# Patient Record
Sex: Male | Born: 1985 | Race: Asian | Hispanic: No | Marital: Single | Smoking: Current some day smoker
Health system: Southern US, Community
[De-identification: ages and names within clinical notes are randomized; demographics above are authoritative.]

## PROBLEM LIST (undated history)

## (undated) DIAGNOSIS — K56609 Unspecified intestinal obstruction, unspecified as to partial versus complete obstruction: Secondary | ICD-10-CM

## (undated) DIAGNOSIS — J45909 Unspecified asthma, uncomplicated: Secondary | ICD-10-CM

## (undated) DIAGNOSIS — F419 Anxiety disorder, unspecified: Secondary | ICD-10-CM

## (undated) DIAGNOSIS — T7840XA Allergy, unspecified, initial encounter: Secondary | ICD-10-CM

## (undated) HISTORY — DX: Anxiety disorder, unspecified: F41.9

## (undated) HISTORY — DX: Allergy, unspecified, initial encounter: T78.40XA

---
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
## 2007-02-05 ENCOUNTER — Emergency Department (HOSPITAL_COMMUNITY): Admission: EM | Admit: 2007-02-05 | Discharge: 2007-02-05 | Payer: Self-pay | Admitting: Emergency Medicine

## 2008-08-03 ENCOUNTER — Emergency Department (HOSPITAL_COMMUNITY): Admission: EM | Admit: 2008-08-03 | Discharge: 2008-08-03 | Payer: Self-pay | Admitting: Gastroenterology
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
## 2013-08-06 ENCOUNTER — Emergency Department (HOSPITAL_COMMUNITY)
Admission: EM | Admit: 2013-08-06 | Discharge: 2013-08-06 | Disposition: A | Discharge status: Home / Self Care | Payer: No Typology Code available for payment source | Attending: Emergency Medicine | Admitting: Emergency Medicine

## 2013-08-06 ENCOUNTER — Encounter (HOSPITAL_COMMUNITY): Payer: Self-pay | Admitting: Emergency Medicine

## 2013-08-06 DIAGNOSIS — Z043 Encounter for examination and observation following other accident: Secondary | ICD-10-CM | POA: Insufficient documentation

## 2013-08-06 DIAGNOSIS — F17200 Nicotine dependence, unspecified, uncomplicated: Secondary | ICD-10-CM | POA: Insufficient documentation

## 2013-08-06 DIAGNOSIS — Y92410 Unspecified street and highway as the place of occurrence of the external cause: Secondary | ICD-10-CM | POA: Insufficient documentation

## 2013-08-06 DIAGNOSIS — Y9389 Activity, other specified: Secondary | ICD-10-CM | POA: Insufficient documentation

## 2013-08-06 DIAGNOSIS — J45909 Unspecified asthma, uncomplicated: Secondary | ICD-10-CM | POA: Insufficient documentation

## 2013-08-06 HISTORY — DX: Unspecified asthma, uncomplicated: J45.909
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
## 2013-08-10 NOTE — ED Provider Notes (Signed)
Medical screening examination/treatment/procedure(s) were performed by non-physician practitioner and as supervising physician I was immediately available for consultation/collaboration.  EKG Interpretation   None         Darnesha Diloreto M Wilhelmena Zea, MD 08/10/13 1604 

## 2014-04-30 ENCOUNTER — Ambulatory Visit (INDEPENDENT_AMBULATORY_CARE_PROVIDER_SITE_OTHER): Payer: 59 | Admitting: Family Medicine

## 2014-04-30 VITALS — BP 122/82 | HR 92 | Temp 98.2°F | Resp 16 | Ht 67.0 in | Wt 146.2 lb

## 2014-04-30 DIAGNOSIS — L239 Allergic contact dermatitis, unspecified cause: Secondary | ICD-10-CM

## 2014-04-30 DIAGNOSIS — L259 Unspecified contact dermatitis, unspecified cause: Secondary | ICD-10-CM

## 2014-04-30 DIAGNOSIS — L299 Pruritus, unspecified: Secondary | ICD-10-CM

## 2014-04-30 MED ORDER — PREDNISONE 20 MG PO TABS
ORAL_TABLET | ORAL | Status: AC
Start: 2014-04-30 — End: ?

## 2014-04-30 MED ORDER — EPINEPHRINE 0.3 MG/0.3ML IJ SOAJ
0.3000 mg | Freq: Once | INTRAMUSCULAR | Status: AC
Start: 2014-04-30 — End: ?

## 2014-04-30 NOTE — Progress Notes (Signed)
[facility] [address], [place] [phone]  Date:  04/30/2014   Name:  Tyler PasseyMatthew Kovaleski   DOB:  05/04/1986   MRN:  [id number]  PCP:  No PCP Per Patient    Chief Complaint: Rash   History of Present Illness:  Tyler PasseyMatthew Demetriou is a 28 y.o. very pleasant male patient who presents with the following:  Here today with a rash on his thighs, feet, and arms that he noted yesterday.  He noted it while he was cooking- he did not eat anything unusual or have any other exposure to plants, animals or unusual chemicals.  He otherwise feels well, but he is itchy.  He took some benadryl last night but it did not seem to help all that much He otherwise feels well and is generally healthy  No SOB, no lip or tongue swelling.  No SOB  There are no active problems to display for this patient.   Past Medical History  Diagnosis Date  . Asthma   . Allergy   . Anxiety     History reviewed. No pertinent past surgical history.  History  Substance Use Topics  . Smoking status: Current Some Day Smoker    Types: Cigarettes  . Smokeless tobacco: Not on file  . Alcohol Use: No    History reviewed. No pertinent family history.  No Known Allergies  Medication list has been reviewed and updated.  No current outpatient prescriptions on file prior to visit.   No current facility-administered medications on file prior to visit.    Review of Systems:  As per HPI- otherwise negative.   Physical Examination: Filed Vitals:   04/30/14 1334  BP: 122/82  Pulse: 92  Temp: 98.2 F (36.8 C)  Resp: 16   Filed Vitals:   04/30/14 1334  Height: 5\' 7"  (1.702 m)  Weight: 146 lb 3.2 oz (66.316 kg)   Body mass index is 22.89 kg/(m^2). Ideal Body Weight: Weight in (lb) to have BMI = 25: 159.3  GEN: WDWN, NAD, Non-toxic, A & O x 3, looks well HEENT: Atraumatic, Normocephalic. Neck supple. No masses, No LAD.  Bilateral TM wnl, oropharynx normal.   PEERL,EOMI.   Ears and Nose: No external deformity. CV: RRR, No M/G/R. No JVD. No thrill. No extra heart sounds. PULM: CTA B, no wheezes, crackles, rhonchi. No retractions. No resp. distress. No accessory muscle use. ABD: S, NT, ND, +BS. No rebound. No HSM. EXTR: No c/c/e NEURO Normal gait.  PSYCH: Normally interactive. Conversant. Not depressed or anxious appearing.  Calm demeanor.  He has a blanching, flat urticarial type rash with confluent urticaria on his thighs and feet, and on his forearms. No purpura.    Assessment and Plan: Itching - Plan: predniSONE (DELTASONE) 20 MG tablet, EPINEPHrine (EPIPEN) 0.3 mg/0.3 mL IJ SOAJ injection  Suspect allergic reaction of some type, may be contact or systemic.  Will treat with prednisone and given epipen to have on hand in case of emergency.  He is familiar with this medication and feels comfortable learning how to use it at home    See patient instructions for more details.     Signed Abbe AmsterdamJessica Copland, MD

## 2014-04-30 NOTE — Patient Instructions (Signed)
You seem to have a mild allergic reaction- we will probably never know what caused this reaction.   Use the prednisone daily as directed.  You can also use benadryl as needed for your itching and rash.   Let me know if you are not better in the next couple of days.  Keep yourself cool, and try a topical itch relief product such as benadryl cream as needed.    If would be smart to have an epipen on hand just in case you have a more serious allergic reaction.  You can pick this up at the drug store and watch a video online to learn how to use it.  Use this only if you have swelling of your lips, tongue or throat, or difficulty breathing.

## 2023-05-15 DIAGNOSIS — S82899A Other fracture of unspecified lower leg, initial encounter for closed fracture: Secondary | ICD-10-CM

## 2023-05-15 DIAGNOSIS — S62109A Fracture of unspecified carpal bone, unspecified wrist, initial encounter for closed fracture: Secondary | ICD-10-CM

## 2023-05-15 HISTORY — DX: Fracture of unspecified carpal bone, unspecified wrist, initial encounter for closed fracture: S62.109A

## 2023-05-15 HISTORY — DX: Other fracture of unspecified lower leg, initial encounter for closed fracture: S82.899A

## 2023-08-18 ENCOUNTER — Emergency Department (HOSPITAL_COMMUNITY)
Admission: EM | Admit: 2023-08-18 | Discharge: 2023-08-18 | Discharge status: Against Medical Advice | Payer: Medicaid Other | Attending: Emergency Medicine | Admitting: Emergency Medicine

## 2023-08-18 ENCOUNTER — Emergency Department (HOSPITAL_COMMUNITY): Payer: Medicaid Other

## 2023-08-18 ENCOUNTER — Encounter (HOSPITAL_COMMUNITY): Payer: Self-pay

## 2023-08-18 ENCOUNTER — Other Ambulatory Visit: Payer: Self-pay

## 2023-08-18 DIAGNOSIS — M25531 Pain in right wrist: Secondary | ICD-10-CM | POA: Diagnosis not present

## 2023-08-18 DIAGNOSIS — Z5321 Procedure and treatment not carried out due to patient leaving prior to being seen by health care provider: Secondary | ICD-10-CM | POA: Diagnosis not present

## 2023-08-18 DIAGNOSIS — M25571 Pain in right ankle and joints of right foot: Secondary | ICD-10-CM | POA: Diagnosis present

## 2023-08-18 HISTORY — DX: Unspecified intestinal obstruction, unspecified as to partial versus complete obstruction: K56.609

## 2023-08-18 NOTE — ED Notes (Addendum)
Radiology has called pt name twice now with no response.
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
## 2023-08-18 NOTE — ED Notes (Signed)
Radiology called for patient multiple times with no response from lobby.

## 2023-08-18 NOTE — ED Notes (Signed)
Patient called for room with no response.
